# Patient Record
Sex: Female | Born: 2000 | Race: Black or African American | Hispanic: No | Marital: Single | Smoking: Never smoker
Health system: Southern US, Community
[De-identification: ages and names within clinical notes are randomized; demographics above are authoritative.]

## PROBLEM LIST (undated history)

## (undated) DIAGNOSIS — F909 Attention-deficit hyperactivity disorder, unspecified type: Secondary | ICD-10-CM

## (undated) DIAGNOSIS — Z789 Other specified health status: Secondary | ICD-10-CM

## (undated) HISTORY — DX: Other specified health status: Z78.9

## (undated) HISTORY — DX: Attention-deficit hyperactivity disorder, unspecified type: F90.9

---
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
## 2022-10-14 ENCOUNTER — Other Ambulatory Visit: Payer: Self-pay

## 2022-10-14 DIAGNOSIS — J029 Acute pharyngitis, unspecified: Secondary | ICD-10-CM | POA: Diagnosis present

## 2022-10-14 DIAGNOSIS — Z20822 Contact with and (suspected) exposure to covid-19: Secondary | ICD-10-CM | POA: Insufficient documentation

## 2022-10-14 NOTE — ED Triage Notes (Signed)
Pt c/o sore throat that started Monday. Pt sts pain is worse on the R side.

## 2022-10-15 ENCOUNTER — Emergency Department
Admission: EM | Admit: 2022-10-15 | Discharge: 2022-10-15 | Disposition: A | Discharge status: Home / Self Care | Payer: Managed Care, Other (non HMO) | Attending: Emergency Medicine | Admitting: Emergency Medicine

## 2022-10-15 DIAGNOSIS — J029 Acute pharyngitis, unspecified: Secondary | ICD-10-CM

## 2022-10-15 LAB — SARS CORONAVIRUS 2 BY RT PCR: SARS Coronavirus 2 by RT PCR: NEGATIVE

## 2022-10-15 LAB — GROUP A STREP BY PCR: Group A Strep by PCR: NOT DETECTED

## 2022-10-15 MED ORDER — DEXAMETHASONE SODIUM PHOSPHATE 10 MG/ML IJ SOLN
10.0000 mg | Freq: Once | INTRAMUSCULAR | Status: AC
  Administered 2022-10-15: 10 mg via INTRAMUSCULAR
  Filled 2022-10-15: qty 1

## 2022-10-15 NOTE — Discharge Instructions (Signed)
Please seek medical attention for any high fevers, chest pain, shortness of breath, change in behavior, persistent vomiting, bloody stool or any other new or concerning symptoms.  

## 2022-10-15 NOTE — ED Provider Notes (Signed)
   [facility] Provider Note    Event Date/Time   First MD Initiated Contact with Patient 10/15/22 [phone]     (approximate)   History   Sore Throat   HPI  Kayla Horton is a 21 y.o. female  who presents to the emergency department today for a sore throat. Started around 5 days ago. Primarily sore on the right side of her throat. It is painful to swallow but she has not been having any difficulty passing food or breathing. She denies any fevers. No nausea or vomiting. 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        Physical Exam   Triage Vital Signs: ED Triage Vitals  Enc Vitals Group     BP 10/14/22 2350 127/77     Pulse Rate 10/14/22 2350 95     Resp 10/14/22 2350 16     Temp 10/14/22 2350 98.3 F (36.8 C)     Temp Source 10/14/22 2350 Oral     SpO2 10/14/22 2350 99 %     Weight 10/14/22 2356 251 lb 5.2 oz (114 kg)     Height 10/14/22 2356 5' (1.524 m)     Head Circumference --      Peak Flow --      Pain Score 10/14/22 2351 5     Pain Loc --      Pain Edu? --      Excl. in [place]? --     Most recent vital signs: Vitals:   10/14/22 2350  BP: 127/77  Pulse: 95  Resp: 16  Temp: 98.3 F (36.8 C)  SpO2: 99%   General: Awake, alert, oriented. CV:  Good peripheral perfusion.  Resp:  Normal effort.  Abd:  No distention.  Other:  Swelling to right tonsil. No uvula deviation.   ED Results / Procedures / Treatments   Labs (all labs ordered are listed, but only abnormal results are displayed) Labs Reviewed  SARS CORONAVIRUS 2 BY RT PCR  GROUP A STREP BY PCR     EKG  None   RADIOLOGY None   PROCEDURES:  Critical Care performed: No  Procedures   MEDICATIONS ORDERED IN ED: Medications - No data to display   IMPRESSION / MDM / [place] / ED COURSE  I reviewed the triage vital signs and the nursing notes.                              Differential diagnosis includes, but is not limited to, PTA, strep,  covid, viral tonsillitis.   Patient's presentation is most consistent with acute presentation with potential threat to life or bodily function.  Patient presented to the department today because of concerns for sore throat.  On exam she does have some swelling to her right tonsil.  There is no uvula deviation.  Strep and COVID were negative.  At this time I think likely viral tonsillitis.  Discussed this with the patient.  Will give patient dose of steroids here in the emergency department.  Will give patient primary care follow-up information.  Discussed return precautions.  FINAL CLINICAL IMPRESSION(S) / ED DIAGNOSES   Final diagnoses:  Pharyngitis, unspecified etiology   Note:  This document was prepared using Dragon voice recognition software and may include unintentional dictation errors.    Nance Pear, MD 10/15/22 [phone]

## 2022-10-17 ENCOUNTER — Emergency Department (HOSPITAL_COMMUNITY): Payer: Managed Care, Other (non HMO)

## 2022-10-17 ENCOUNTER — Encounter (HOSPITAL_COMMUNITY): Payer: Self-pay | Admitting: Emergency Medicine

## 2022-10-17 ENCOUNTER — Other Ambulatory Visit: Payer: Self-pay

## 2022-10-17 ENCOUNTER — Emergency Department (HOSPITAL_COMMUNITY)
Admission: EM | Admit: 2022-10-17 | Discharge: 2022-10-17 | Disposition: A | Discharge status: Home / Self Care | Payer: Managed Care, Other (non HMO) | Attending: Emergency Medicine | Admitting: Emergency Medicine

## 2022-10-17 DIAGNOSIS — Z20822 Contact with and (suspected) exposure to covid-19: Secondary | ICD-10-CM | POA: Diagnosis not present

## 2022-10-17 DIAGNOSIS — F17200 Nicotine dependence, unspecified, uncomplicated: Secondary | ICD-10-CM | POA: Diagnosis not present

## 2022-10-17 DIAGNOSIS — J069 Acute upper respiratory infection, unspecified: Secondary | ICD-10-CM | POA: Diagnosis not present

## 2022-10-17 DIAGNOSIS — J029 Acute pharyngitis, unspecified: Secondary | ICD-10-CM | POA: Diagnosis present

## 2022-10-17 LAB — CBC WITH DIFFERENTIAL/PLATELET
Abs Immature Granulocytes: 0.07 10*3/uL (ref 0.00–0.07)
Basophils Absolute: 0.1 10*3/uL (ref 0.0–0.1)
Basophils Relative: 1 %
Eosinophils Absolute: 0.2 10*3/uL (ref 0.0–0.5)
Eosinophils Relative: 1 %
HCT: 36.8 % (ref 36.0–46.0)
Hemoglobin: 12 g/dL (ref 12.0–15.0)
Immature Granulocytes: 0 %
Lymphocytes Relative: 6 %
Lymphs Abs: 1 10*3/uL (ref 0.7–4.0)
MCH: 27.3 pg (ref 26.0–34.0)
MCHC: 32.6 g/dL (ref 30.0–36.0)
MCV: 83.8 fL (ref 80.0–100.0)
Monocytes Absolute: 0.8 10*3/uL (ref 0.1–1.0)
Monocytes Relative: 5 %
Neutro Abs: 13.9 10*3/uL — ABNORMAL HIGH (ref 1.7–7.7)
Neutrophils Relative %: 87 %
Platelets: 386 10*3/uL (ref 150–400)
RBC: 4.39 MIL/uL (ref 3.87–5.11)
RDW: 13 % (ref 11.5–15.5)
WBC: 16 10*3/uL — ABNORMAL HIGH (ref 4.0–10.5)
nRBC: 0 % (ref 0.0–0.2)

## 2022-10-17 LAB — COMPREHENSIVE METABOLIC PANEL
ALT: 32 U/L (ref 0–44)
AST: 24 U/L (ref 15–41)
Albumin: 3.6 g/dL (ref 3.5–5.0)
Alkaline Phosphatase: 56 U/L (ref 38–126)
Anion gap: 11 (ref 5–15)
BUN: 8 mg/dL (ref 6–20)
CO2: 24 mmol/L (ref 22–32)
Calcium: 9.3 mg/dL (ref 8.9–10.3)
Chloride: 102 mmol/L (ref 98–111)
Creatinine, Ser: 0.75 mg/dL (ref 0.44–1.00)
GFR, Estimated: >60 mL/min (ref >60)
Glucose, Bld: 117 mg/dL — ABNORMAL HIGH (ref 70–99)
Potassium: 3.9 mmol/L (ref 3.5–5.1)
Sodium: 137 mmol/L (ref 135–145)
Total Bilirubin: 0.4 mg/dL (ref 0.3–1.2)
Total Protein: 7 g/dL (ref 6.5–8.1)

## 2022-10-17 LAB — D-DIMER, QUANTITATIVE: D-Dimer, Quant: 0.4 ug/mL-FEU (ref 0.00–0.50)

## 2022-10-17 LAB — LIPASE, BLOOD: Lipase: 25 U/L (ref 11–51)

## 2022-10-17 LAB — I-STAT BETA HCG BLOOD, ED (MC, WL, AP ONLY): I-stat hCG, quantitative: <5 m[IU]/mL (ref <5)

## 2022-10-17 LAB — SARS CORONAVIRUS 2 BY RT PCR: SARS Coronavirus 2 by RT PCR: NEGATIVE

## 2022-10-17 LAB — TROPONIN I (HIGH SENSITIVITY)
Troponin I (High Sensitivity): 5 ng/L (ref <18)
Troponin I (High Sensitivity): 4 ng/L (ref <18)

## 2022-10-17 LAB — MONONUCLEOSIS SCREEN: Mono Screen: NEGATIVE

## 2022-10-17 MED ORDER — PREDNISONE 20 MG PO TABS: 40.0000 mg | ORAL_TABLET | Freq: Every day | ORAL | 0 refills | Status: DC

## 2022-10-17 MED ORDER — DOXYCYCLINE HYCLATE 100 MG PO CAPS: 100.0000 mg | ORAL_CAPSULE | Freq: Two times a day (BID) | ORAL | 0 refills | Status: DC

## 2022-10-17 MED ORDER — PREDNISONE 20 MG PO TABS
60.0000 mg | ORAL_TABLET | ORAL | Status: AC
  Administered 2022-10-17: 60 mg via ORAL
  Filled 2022-10-17: qty 3

## 2022-10-17 MED ORDER — DOXYCYCLINE HYCLATE 100 MG PO TABS
100.0000 mg | ORAL_TABLET | Freq: Once | ORAL | Status: AC
  Administered 2022-10-17: 100 mg via ORAL
  Filled 2022-10-17: qty 1

## 2022-10-17 NOTE — ED Triage Notes (Signed)
Per EMS, pt from home, SOB and common cold symptoms, seen at urgent care.  Pt states she is on her 2nd dose of PCN.  No reports she back pain when she takes a deep breath.     115/90 HR 98 RR 20 100% RA

## 2022-10-17 NOTE — ED Provider Triage Note (Signed)
Emergency Medicine Provider Triage Evaluation Note  Kayla Horton , a 21 y.o. female  was evaluated in triage.  Pt complains of shortness of breath and chest tightness as well as nausea vomiting and cold-like symptoms for the last few days.  Is been seen in urgent care twice, was prescribed penicillin which she has had 2 doses.  Also given a shot of Decadron at that time.  States she had a nebulizer treatment this morning with some improvement in her symptoms but did not come to the hospital. LMP 2 weeks ago Review of Systems  Positive: As above Negative: Hematemesis  Physical Exam  BP 134/85 (BP Location: Right Arm)   Pulse (!) 101   Temp 98.9 F (37.2 C) (Oral)   Resp 17   Ht 5' (1.524 m)   Wt 114 kg   LMP 09/29/2022 (Approximate)   SpO2 91%   BMI 49.08 kg/m  Gen:   Awake, no distress   Resp:  Normal effort  MSK:   Moves extremities without difficulty  Other:  Lungs CTA B.  Mildly tachycardic no M/R/G.  Medical Decision Making  Medically screening exam initiated at 5:12 AM.  Appropriate orders placed.  Kayla Horton was informed that the remainder of the evaluation will be completed by another provider, this initial triage assessment does not replace that evaluation, and the importance of remaining in the ED until their evaluation is complete.  This chart was dictated using voice recognition software, Dragon. Despite the best efforts of this provider to proofread and correct errors, errors may still occur which can change documentation meaning.    Emeline Darling, PA-C 10/17/22 0518

## 2022-10-17 NOTE — ED Provider Notes (Signed)
[facility] DEPART[facility] Provider Note   CSN: [id number] Arrival date & time: 10/17/22  0456     History  No chief complaint on file.   Kayla Horton is a 21 y.o. female.  HPI Female presents with her mother who assists with history.  Patient smokes, drinks, about a week ago developed sore throat.  She was seen, evaluated, then and twice since then.  She has been diagnosis pharyngitis, received empiric therapy for strep with IM penicillin, and received 1 dose of steroids and 1 breathing treatment.  She notes that she continues to feel poorly, with cough, shortness of breath, sore throat.  No fever, no vomiting, no confusion.  Patient accompanied by her mother who assists with history after obtaining consent.    Home Medications Prior to Admission medications   Medication Sig Start Date End Date Taking? Authorizing Provider  lisdexamfetamine (VYVANSE) 30 MG capsule Take 1 capsule (30 mg total) by mouth every morning. 10/16/13   Susy Frizzle, MD  ranitidine (ZANTAC) 75 MG tablet Take 1 tablet (75 mg total) by mouth 2 (two) times daily. 11/17/16   Schaevitz, Randall An, MD      Allergies    Patient has no known allergies.    Review of Systems   Review of Systems  All other systems reviewed and are negative.   Physical Exam Updated Vital Signs BP 110/74 (BP Location: Left Arm)   Pulse 96   Temp 98.7 F (37.1 C) (Oral)   Resp 17   Ht 5' (1.524 m)   Wt 114 kg   LMP 09/29/2022 (Approximate)   SpO2 97%   BMI 49.08 kg/m  Physical Exam Vitals and nursing note reviewed.  Constitutional:      General: She is not in acute distress.    Appearance: She is well-developed. She is obese.  HENT:     Head: Normocephalic and atraumatic.     Mouth/Throat:   Eyes:     Conjunctiva/sclera: Conjunctivae normal.  Cardiovascular:     Rate and Rhythm: Normal rate and regular rhythm.  Pulmonary:     Effort: Pulmonary effort is normal. No respiratory  distress.     Breath sounds: Normal breath sounds. No stridor.  Abdominal:     General: There is no distension.  Skin:    General: Skin is warm and dry.  Neurological:     Mental Status: She is alert and oriented to person, place, and time.     Cranial Nerves: No cranial nerve deficit.  Psychiatric:        Mood and Affect: Mood normal.     ED Results / Procedures / Treatments   Labs (all labs ordered are listed, but only abnormal results are displayed) Labs Reviewed  CBC WITH DIFFERENTIAL/PLATELET - Abnormal; Notable for the following components:      Result Value   WBC 16.0 (*)    Neutro Abs 13.9 (*)    All other components within normal limits  COMPREHENSIVE METABOLIC PANEL - Abnormal; Notable for the following components:   Glucose, Bld 117 (*)    All other components within normal limits  SARS CORONAVIRUS 2 BY RT PCR  LIPASE, BLOOD  MONONUCLEOSIS SCREEN  D-DIMER, QUANTITATIVE  I-STAT BETA HCG BLOOD, ED ([facility], [facility], [facility] ONLY)  TROPONIN I (HIGH SENSITIVITY)  TROPONIN I (HIGH SENSITIVITY)    EKG EKG Interpretation  Date/Time:  Monday October 17 2022 04:53:02 EST Ventricular Rate:  105 PR Interval:  166 QRS Duration: 70  QT Interval:  322 QTC Calculation: 425 R Axis:   72 Text Interpretation: Sinus tachycardia Otherwise normal ECG No previous ECGs available Confirmed by Gerhard Munch [phone]) on 10/17/2022 11:31:00 AM  Radiology DG Chest 2 View  Result Date: 10/17/2022 CLINICAL DATA:  Shortness of breath EXAM: CHEST - 2 VIEW COMPARISON:  None Available. FINDINGS: Normal heart size and mediastinal contours. No acute infiltrate or edema. No effusion or pneumothorax. No acute osseous findings. IMPRESSION: No active cardiopulmonary disease. Electronically Signed   By: Tiburcio Pea M.D.   On: 10/17/2022 05:37    Procedures Procedures    Medications Ordered in ED Medications  predniSONE (DELTASONE) tablet 60 mg (has no administration in time range)  doxycycline  (VIBRA-TABS) tablet 100 mg (has no administration in time range)    ED Course/ Medical Decision Making/ A&P                           Medical Decision Making Well-appearing, obese, young female presents with dyspnea, sore throat.  Patient is awake, alert, afebrile.  Patient is a smoker, drinker, some suspicion for bronchitis given her generally reassuring prior evaluations.  However, with consideration of the differential, additional studies including D-dimer, Monospot, performed, all reassuring.  Patient had no oxygen requirement, and no hypotension, no fever started on antibiotics, steroids for presumed bronchitis, is comfortable following up with her primary care physician.  Amount and/or Complexity of Data Reviewed Independent Historian: parent External Data Reviewed: notes.    Details: UC notes re pharyngitis Labs: ordered. Radiology:  Decision-making details documented in ED Course. ECG/medicine tests:  Decision-making details documented in ED Course.  Risk Prescription drug management. Decision regarding hospitalization.           Final Clinical Impression(s) / ED Diagnoses Final diagnoses:  Upper respiratory tract infection, unspecified type    Rx / DC Orders ED Discharge Orders          Ordered    predniSONE (DELTASONE) 20 MG tablet  Daily with breakfast,   Status:  Discontinued        10/17/22 1403    doxycycline (VIBRAMYCIN) 100 MG capsule  2 times daily,   Status:  Discontinued        10/17/22 1403    doxycycline (VIBRAMYCIN) 100 MG capsule  2 times daily,   Status:  Discontinued        10/17/22 1404    predniSONE (DELTASONE) 20 MG tablet  Daily with breakfast,   Status:  Discontinued        10/17/22 1404              Gerhard Munch, MD 10/17/22 1412

## 2022-10-17 NOTE — Discharge Instructions (Addendum)
Please be sure to take all medic as directed and monitor your condition carefully.  Do not hesitate to return here for concerning changes in your condition.

## 2023-02-15 ENCOUNTER — Emergency Department
Admission: EM | Admit: 2023-02-15 | Discharge: 2023-02-15 | Disposition: A | Discharge status: Home / Self Care | Payer: No Typology Code available for payment source | Attending: Emergency Medicine | Admitting: Emergency Medicine

## 2023-02-15 ENCOUNTER — Encounter: Payer: Self-pay | Admitting: Emergency Medicine

## 2023-02-15 ENCOUNTER — Emergency Department: Payer: No Typology Code available for payment source

## 2023-02-15 ENCOUNTER — Other Ambulatory Visit: Payer: Self-pay

## 2023-02-15 DIAGNOSIS — M542 Cervicalgia: Secondary | ICD-10-CM | POA: Diagnosis present

## 2023-02-15 DIAGNOSIS — Y92410 Unspecified street and highway as the place of occurrence of the external cause: Secondary | ICD-10-CM | POA: Diagnosis not present

## 2023-02-15 DIAGNOSIS — S20219A Contusion of unspecified front wall of thorax, initial encounter: Secondary | ICD-10-CM | POA: Diagnosis not present

## 2023-02-15 DIAGNOSIS — S161XXA Strain of muscle, fascia and tendon at neck level, initial encounter: Secondary | ICD-10-CM | POA: Diagnosis not present

## 2023-02-15 DIAGNOSIS — R1032 Left lower quadrant pain: Secondary | ICD-10-CM | POA: Insufficient documentation

## 2023-02-15 LAB — URINALYSIS, ROUTINE W REFLEX MICROSCOPIC
Bilirubin Urine: NEGATIVE
Glucose, UA: NEGATIVE mg/dL
Ketones, ur: NEGATIVE mg/dL
Leukocytes,Ua: NEGATIVE
Nitrite: NEGATIVE
Protein, ur: 30 mg/dL — AB
Specific Gravity, Urine: 1.027 (ref 1.005–1.030)
pH: 5 (ref 5.0–8.0)

## 2023-02-15 LAB — COMPREHENSIVE METABOLIC PANEL
ALT: 32 U/L (ref 0–44)
AST: 23 U/L (ref 15–41)
Albumin: 3.7 g/dL (ref 3.5–5.0)
Alkaline Phosphatase: 50 U/L (ref 38–126)
Anion gap: 7 (ref 5–15)
BUN: 16 mg/dL (ref 6–20)
CO2: 20 mmol/L — ABNORMAL LOW (ref 22–32)
Calcium: 8.5 mg/dL — ABNORMAL LOW (ref 8.9–10.3)
Chloride: 108 mmol/L (ref 98–111)
Creatinine, Ser: 0.75 mg/dL (ref 0.44–1.00)
GFR, Estimated: >60 mL/min (ref >60)
Glucose, Bld: 136 mg/dL — ABNORMAL HIGH (ref 70–99)
Potassium: 3.9 mmol/L (ref 3.5–5.1)
Sodium: 135 mmol/L (ref 135–145)
Total Bilirubin: 0.4 mg/dL (ref 0.3–1.2)
Total Protein: 7.2 g/dL (ref 6.5–8.1)

## 2023-02-15 LAB — CBC WITH DIFFERENTIAL/PLATELET
Abs Immature Granulocytes: 0.03 10*3/uL (ref 0.00–0.07)
Basophils Absolute: 0 10*3/uL (ref 0.0–0.1)
Basophils Relative: 0 %
Eosinophils Absolute: 0 10*3/uL (ref 0.0–0.5)
Eosinophils Relative: 0 %
HCT: 34.9 % — ABNORMAL LOW (ref 36.0–46.0)
Hemoglobin: 11.2 g/dL — ABNORMAL LOW (ref 12.0–15.0)
Immature Granulocytes: 0 %
Lymphocytes Relative: 20 %
Lymphs Abs: 2.2 10*3/uL (ref 0.7–4.0)
MCH: 26.4 pg (ref 26.0–34.0)
MCHC: 32.1 g/dL (ref 30.0–36.0)
MCV: 82.1 fL (ref 80.0–100.0)
Monocytes Absolute: 0.4 10*3/uL (ref 0.1–1.0)
Monocytes Relative: 3 %
Neutro Abs: 8.2 10*3/uL — ABNORMAL HIGH (ref 1.7–7.7)
Neutrophils Relative %: 77 %
Platelets: 369 10*3/uL (ref 150–400)
RBC: 4.25 MIL/uL (ref 3.87–5.11)
RDW: 13.7 % (ref 11.5–15.5)
WBC: 10.8 10*3/uL — ABNORMAL HIGH (ref 4.0–10.5)
nRBC: 0 % (ref 0.0–0.2)

## 2023-02-15 LAB — POC URINE PREG, ED: Preg Test, Ur: NEGATIVE

## 2023-02-15 LAB — TROPONIN I (HIGH SENSITIVITY): Troponin I (High Sensitivity): 3 ng/L (ref <18)

## 2023-02-15 MED ORDER — MORPHINE SULFATE (PF) 4 MG/ML IV SOLN
4.0000 mg | Freq: Once | INTRAVENOUS | Status: AC
  Administered 2023-02-15: 4 mg via INTRAVENOUS
  Filled 2023-02-15: qty 1

## 2023-02-15 MED ORDER — IOHEXOL 350 MG/ML SOLN
100.0000 mL | Freq: Once | INTRAVENOUS | Status: AC | PRN
  Administered 2023-02-15: 100 mL via INTRAVENOUS

## 2023-02-15 MED ORDER — ONDANSETRON HCL 4 MG/2ML IJ SOLN
4.0000 mg | Freq: Once | INTRAMUSCULAR | Status: AC
  Administered 2023-02-15: 4 mg via INTRAVENOUS
  Filled 2023-02-15: qty 2

## 2023-02-15 NOTE — ED Notes (Signed)
See triage note.   C-collar removed due to CT of being negative. Pt currently eating [employer]. NAD noted.

## 2023-02-15 NOTE — ED Notes (Signed)
Pt back from CT
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
## 2023-02-15 NOTE — ED Provider Notes (Signed)
EKG interpreted by me at 1850 heart rate 70 QRS 90 QTc 400 Normal sinus rhythm no evidence of acute ischemia   Delman Kitten, MD 02/15/23 2301
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
## 2023-03-07 ENCOUNTER — Emergency Department
Admission: EM | Admit: 2023-03-07 | Discharge: 2023-03-07 | Disposition: A | Discharge status: Home / Self Care | Payer: 59 | Attending: Emergency Medicine | Admitting: Emergency Medicine

## 2023-03-07 ENCOUNTER — Encounter: Payer: Self-pay | Admitting: Emergency Medicine

## 2023-03-07 ENCOUNTER — Other Ambulatory Visit: Payer: Self-pay

## 2023-03-07 DIAGNOSIS — R109 Unspecified abdominal pain: Secondary | ICD-10-CM | POA: Insufficient documentation

## 2023-03-07 DIAGNOSIS — R112 Nausea with vomiting, unspecified: Secondary | ICD-10-CM | POA: Insufficient documentation

## 2023-03-07 LAB — COMPREHENSIVE METABOLIC PANEL
ALT: 21 U/L (ref 0–44)
AST: 24 U/L (ref 15–41)
Albumin: 4 g/dL (ref 3.5–5.0)
Alkaline Phosphatase: 49 U/L (ref 38–126)
Anion gap: 4 — ABNORMAL LOW (ref 5–15)
BUN: 15 mg/dL (ref 6–20)
CO2: 21 mmol/L — ABNORMAL LOW (ref 22–32)
Calcium: 8.7 mg/dL — ABNORMAL LOW (ref 8.9–10.3)
Chloride: 107 mmol/L (ref 98–111)
Creatinine, Ser: 0.78 mg/dL (ref 0.44–1.00)
GFR, Estimated: >60 mL/min (ref >60)
Glucose, Bld: 107 mg/dL — ABNORMAL HIGH (ref 70–99)
Potassium: 4 mmol/L (ref 3.5–5.1)
Sodium: 132 mmol/L — ABNORMAL LOW (ref 135–145)
Total Bilirubin: 0.7 mg/dL (ref 0.3–1.2)
Total Protein: 7.5 g/dL (ref 6.5–8.1)

## 2023-03-07 LAB — URINALYSIS, ROUTINE W REFLEX MICROSCOPIC
Bacteria, UA: NONE SEEN
Bilirubin Urine: NEGATIVE
Glucose, UA: NEGATIVE mg/dL
Ketones, ur: NEGATIVE mg/dL
Leukocytes,Ua: NEGATIVE
Nitrite: NEGATIVE
Protein, ur: 30 mg/dL — AB
Specific Gravity, Urine: 1.029 (ref 1.005–1.030)
pH: 5 (ref 5.0–8.0)

## 2023-03-07 LAB — CBC WITH DIFFERENTIAL/PLATELET
Abs Immature Granulocytes: 0.02 10*3/uL (ref 0.00–0.07)
Basophils Absolute: 0 10*3/uL (ref 0.0–0.1)
Basophils Relative: 0 %
Eosinophils Absolute: 0 10*3/uL (ref 0.0–0.5)
Eosinophils Relative: 1 %
HCT: 40.4 % (ref 36.0–46.0)
Hemoglobin: 13 g/dL (ref 12.0–15.0)
Immature Granulocytes: 0 %
Lymphocytes Relative: 20 %
Lymphs Abs: 1.4 10*3/uL (ref 0.7–4.0)
MCH: 26.6 pg (ref 26.0–34.0)
MCHC: 32.2 g/dL (ref 30.0–36.0)
MCV: 82.6 fL (ref 80.0–100.0)
Monocytes Absolute: 0.3 10*3/uL (ref 0.1–1.0)
Monocytes Relative: 4 %
Neutro Abs: 5 10*3/uL (ref 1.7–7.7)
Neutrophils Relative %: 75 %
Platelets: 414 10*3/uL — ABNORMAL HIGH (ref 150–400)
RBC: 4.89 MIL/uL (ref 3.87–5.11)
RDW: 13.4 % (ref 11.5–15.5)
WBC: 6.7 10*3/uL (ref 4.0–10.5)
nRBC: 0 % (ref 0.0–0.2)

## 2023-03-07 LAB — LIPASE, BLOOD: Lipase: 31 U/L (ref 11–51)

## 2023-03-07 LAB — POC URINE PREG, ED: Preg Test, Ur: NEGATIVE

## 2023-03-07 MED ORDER — ONDANSETRON HCL 4 MG/2ML IJ SOLN
4.0000 mg | Freq: Once | INTRAMUSCULAR | Status: AC
  Administered 2023-03-07: 4 mg via INTRAVENOUS
  Filled 2023-03-07: qty 2

## 2023-03-07 MED ORDER — SODIUM CHLORIDE 0.9 % IV BOLUS (SEPSIS)
1000.0000 mL | Freq: Once | INTRAVENOUS | Status: AC
  Administered 2023-03-07: 1000 mL via INTRAVENOUS

## 2023-03-07 MED ORDER — KETOROLAC TROMETHAMINE 30 MG/ML IJ SOLN
30.0000 mg | Freq: Once | INTRAMUSCULAR | Status: AC
  Administered 2023-03-07: 30 mg via INTRAVENOUS
  Filled 2023-03-07: qty 1

## 2023-03-07 MED ORDER — ONDANSETRON 4 MG PO TBDP: 4.0000 mg | ORAL_TABLET | Freq: Four times a day (QID) | ORAL | 0 refills | Status: AC | PRN

## 2023-03-07 NOTE — Discharge Instructions (Addendum)
Your labs, urine were reassuring today.  I recommend a bland diet for the next several days.  You may use over-the-counter Imodium if you begin having diarrhea.  I suspect you either have food poisoning or a viral illness causing your symptoms.  Either way, they are treated similarly.  You may alternate Tylenol 1000 mg every 6 hours as needed for pain, fever and Ibuprofen 800 mg every 6-8 hours as needed for pain, fever.  Please take Ibuprofen with food.  Do not take more than 4000 mg of Tylenol (acetaminophen) in a 24 hour period.

## 2023-03-07 NOTE — ED Provider Notes (Signed)
[facility] Provider Note    Event Date/Time   First MD Initiated Contact with Patient 03/07/23 0250     (approximate)   History   Emesis   HPI  Kayla Horton is a 22 y.o. female with history of ADHD, obesity who presents to the emergency department with diffuse sharp abdominal pain, vomiting that started at midnight.  Her boyfriend is here with similar symptoms.  No diarrhea.  No known fevers.  No previous abdominal surgery.  No dysuria, hematuria, vaginal bleeding or discharge.   History provided by patient.    Past Medical History:  Diagnosis Date   ADHD (attention deficit hyperactivity disorder)    Medical history non-contributory     History reviewed. No pertinent surgical history.  MEDICATIONS:  Prior to Admission medications   Medication Sig Start Date End Date Taking? Authorizing Provider  lisdexamfetamine (VYVANSE) 30 MG capsule Take 1 capsule (30 mg total) by mouth every morning. 10/16/13   Susy Frizzle, MD  ranitidine (ZANTAC) 75 MG tablet Take 1 tablet (75 mg total) by mouth 2 (two) times daily. 11/17/16   Orbie Pyo, MD    Physical Exam   Triage Vital Signs: ED Triage Vitals  Enc Vitals Group     BP 03/07/23 0252 129/81     Pulse Rate 03/07/23 0252 (!) 107     Resp 03/07/23 0252 18     Temp 03/07/23 0252 98 F (36.7 C)     Temp Source 03/07/23 0252 Oral     SpO2 03/07/23 0252 99 %     Weight 03/07/23 0253 253 lb (114.8 kg)     Height 03/07/23 0253 5' (1.524 m)     Head Circumference --      Peak Flow --      Pain Score 03/07/23 0253 0     Pain Loc --      Pain Edu? --      Excl. in [place]? --     Most recent vital signs: Vitals:   03/07/23 0252 03/07/23 0300  BP: 129/81 115/78  Pulse: (!) 107 92  Resp: 18   Temp: 98 F (36.7 C)   SpO2: 99% 100%    CONSTITUTIONAL: Alert, responds appropriately to questions. Well-appearing; well-nourished HEAD: Normocephalic, atraumatic EYES: Conjunctivae  clear, pupils appear equal, sclera nonicteric ENT: normal nose; moist mucous membranes NECK: Supple, normal ROM CARD: RRR; S1 and S2 appreciated RESP: Normal chest excursion without splinting or tachypnea; breath sounds clear and equal bilaterally; no wheezes, no rhonchi, no rales, no hypoxia or respiratory distress, speaking full sentences ABD/GI: Non-distended; soft, non-tender, no rebound, no guarding, no peritoneal signs, no tenderness at McBurney's point BACK: The back appears normal EXT: Normal ROM in all joints; no deformity noted, no edema SKIN: Normal color for age and race; warm; no rash on exposed skin NEURO: Moves all extremities equally, normal speech PSYCH: The patient's mood and manner are appropriate.   ED Results / Procedures / Treatments   LABS: (all labs ordered are listed, but only abnormal results are displayed) Labs Reviewed  CBC WITH DIFFERENTIAL/PLATELET - Abnormal; Notable for the following components:      Result Value   Platelets 414 (*)    All other components within normal limits  COMPREHENSIVE METABOLIC PANEL - Abnormal; Notable for the following components:   Sodium 132 (*)    CO2 21 (*)    Glucose, Bld 107 (*)    Calcium 8.7 (*)    Anion  gap 4 (*)    All other components within normal limits  URINALYSIS, ROUTINE W REFLEX MICROSCOPIC - Abnormal; Notable for the following components:   Color, Urine YELLOW (*)    APPearance CLOUDY (*)    Hgb urine dipstick MODERATE (*)    Protein, ur 30 (*)    All other components within normal limits  LIPASE, BLOOD  POC URINE PREG, ED     EKG:   RADIOLOGY: My personal review and interpretation of imaging:    I have personally reviewed all radiology reports.   No results found.   PROCEDURES:  Critical Care performed: No    Procedures    IMPRESSION / MDM / ASSESSMENT AND PLAN / ED COURSE  I reviewed the triage vital signs and the nursing notes.    Patient here with abdominal pain, nausea and  vomiting.  Boyfriend with similar symptoms.    DIFFERENTIAL DIAGNOSIS (includes but not limited to):   Gastroenteritis, food poisoning, doubt appendicitis, colitis, bowel obstruction, perforation, cholecystitis, pancreatitis   Patient's presentation is most consistent with acute complicated illness / injury requiring diagnostic workup.   PLAN: Will obtain abdominal labs, urine.  Will give IV fluids, Toradol, Zofran for symptomatic relief and fluid challenge.   MEDICATIONS GIVEN IN ED: Medications  ketorolac (TORADOL) 30 MG/ML injection 30 mg (30 mg Intravenous Given 03/07/23 0310)  ondansetron (ZOFRAN) injection 4 mg (4 mg Intravenous Given 03/07/23 0310)  sodium chloride 0.9 % bolus 1,000 mL (0 mLs Intravenous Stopped 03/07/23 0340)     ED COURSE: Patient's labs show no leukocytosis, normal electrolytes, LFTs and lipase.  Urine shows no obvious signs of infection.  There are some red blood cells but also many squamous cells and this seems to be a contaminated sample.  She is not having urinary symptoms.  Pregnancy test negative.  Patient feeling better and tolerating p.o.  Will discharge with prescription of Zofran, supportive care instructions for over-the-counter medications and recommended bland diet for the next several days.   At this time, I do not feel there is any life-threatening condition present. I reviewed all nursing notes, vitals, pertinent previous records.  All lab and urine results, EKGs, imaging ordered have been independently reviewed and interpreted by myself.  I reviewed all available radiology reports from any imaging ordered this visit.  Based on my assessment, I feel the patient is safe to be discharged home without further emergent workup and can continue workup as an outpatient as needed. Discussed all findings, treatment plan as well as usual and customary return precautions.  They verbalize understanding and are comfortable with this plan.  Outpatient follow-up has  been provided as needed.  All questions have been answered.    CONSULTS:  none   OUTSIDE RECORDS REVIEWED: Reviewed last PCP visit in 2014.       FINAL CLINICAL IMPRESSION(S) / ED DIAGNOSES   Final diagnoses:  Nausea and vomiting in adult     Rx / DC Orders   ED Discharge Orders          Ordered    ondansetron (ZOFRAN-ODT) 4 MG disintegrating tablet  Every 6 hours PRN        03/07/23 0354             Note:  This document was prepared using Dragon voice recognition software and may include unintentional dictation errors.   Mishaal Lansdale, Delice Bison, DO 03/07/23 [phone]

## 2023-03-07 NOTE — ED Notes (Signed)
Pt provided water -tolerated PO challenge. Endorses resolution of her pain following medication.

## 2023-03-07 NOTE — ED Triage Notes (Signed)
Patient ambulatory to triage with steady gait, without difficulty or distress noted; pt reports N/V/D since midnight; here with SO to be seen for same

## 2023-03-07 NOTE — ED Notes (Signed)
E-signature pad unavailable - Pt verbalized understanding of D/C information - no additional concerns at this time.
